# Patient Record
Sex: Female | Born: 2005
Health system: Southern US, Community
[De-identification: ages and names within clinical notes are randomized; demographics above are authoritative.]

## PROBLEM LIST (undated history)

## (undated) DIAGNOSIS — F909 Attention-deficit hyperactivity disorder, unspecified type: Secondary | ICD-10-CM

## (undated) DIAGNOSIS — K589 Irritable bowel syndrome without diarrhea: Secondary | ICD-10-CM

## (undated) DIAGNOSIS — F419 Anxiety disorder, unspecified: Secondary | ICD-10-CM

## (undated) DIAGNOSIS — F32A Depression, unspecified: Secondary | ICD-10-CM

## (undated) HISTORY — PX: ESOPHAGOGASTRODUODENOSCOPY: SHX1529

## (undated) HISTORY — PX: NO PAST SURGERIES: SHX2092

## (undated) HISTORY — DX: Irritable bowel syndrome, unspecified: K58.9

---
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
## 2023-01-20 ENCOUNTER — Other Ambulatory Visit (HOSPITAL_COMMUNITY): Payer: Self-pay

## 2023-01-20 DIAGNOSIS — F411 Generalized anxiety disorder: Secondary | ICD-10-CM | POA: Diagnosis not present

## 2023-01-20 MED ORDER — SERTRALINE HCL 50 MG PO TABS
50.0000 mg | ORAL_TABLET | Freq: Every morning | ORAL | 0 refills | Status: AC
  Filled 2023-01-20: qty 90, 90d supply, fill #0

## 2023-01-21 ENCOUNTER — Other Ambulatory Visit: Payer: Self-pay

## 2023-01-24 ENCOUNTER — Other Ambulatory Visit (HOSPITAL_COMMUNITY): Payer: Self-pay

## 2023-01-26 ENCOUNTER — Other Ambulatory Visit (HOSPITAL_COMMUNITY): Payer: Self-pay

## 2023-01-27 ENCOUNTER — Other Ambulatory Visit: Payer: Self-pay

## 2023-01-27 ENCOUNTER — Other Ambulatory Visit (HOSPITAL_COMMUNITY): Payer: Self-pay

## 2023-01-27 DIAGNOSIS — J029 Acute pharyngitis, unspecified: Secondary | ICD-10-CM | POA: Diagnosis not present

## 2023-01-27 MED ORDER — PREDNISONE 10 MG PO TABS
40.0000 mg | ORAL_TABLET | Freq: Every day | ORAL | 0 refills | Status: AC
  Filled 2023-01-27: qty 20, 5d supply, fill #0

## 2023-01-27 MED ORDER — NORELGESTROMIN-ETH ESTRADIOL 150-35 MCG/24HR TD PTWK
MEDICATED_PATCH | TRANSDERMAL | 0 refills | Status: DC
  Filled 2023-01-27: qty 9, 84d supply, fill #0

## 2023-01-27 MED ORDER — FLUTICASONE PROPIONATE 50 MCG/ACT NA SUSP
1.0000 | Freq: Every day | NASAL | 0 refills | Status: AC
  Filled 2023-01-27: qty 16, 30d supply, fill #0

## 2023-02-09 ENCOUNTER — Ambulatory Visit
Admission: EM | Admit: 2023-02-09 | Discharge: 2023-02-09 | Disposition: A | Discharge status: Home / Self Care | Payer: 59 | Attending: Family Medicine | Admitting: Family Medicine

## 2023-02-09 ENCOUNTER — Other Ambulatory Visit: Payer: Self-pay

## 2023-02-09 ENCOUNTER — Ambulatory Visit: Payer: 59

## 2023-02-09 ENCOUNTER — Encounter: Payer: Self-pay | Admitting: Emergency Medicine

## 2023-02-09 DIAGNOSIS — S61213A Laceration without foreign body of left middle finger without damage to nail, initial encounter: Secondary | ICD-10-CM

## 2023-02-09 DIAGNOSIS — S67193A Crushing injury of left middle finger, initial encounter: Secondary | ICD-10-CM

## 2023-02-09 DIAGNOSIS — S6992XA Unspecified injury of left wrist, hand and finger(s), initial encounter: Secondary | ICD-10-CM

## 2023-02-09 MED ORDER — CEPHALEXIN 500 MG PO CAPS: 500.0000 mg | ORAL_CAPSULE | Freq: Two times a day (BID) | ORAL | 0 refills | Status: AC

## 2023-02-09 NOTE — Discharge Instructions (Addendum)
Patient advised of left middle finger x-ray results advised patient to keep wound area dry and clean for the next 36 hours.  Advised patient to allow Steri-Strips to come off on its own.  Advised may follow-up in 48 hours here for wound check.  Advised if symptoms worsen and/or unresolved please follow-up with PCP or here for further evaluation.

## 2023-02-09 NOTE — ED Notes (Signed)
Provided ice pack

## 2023-02-09 NOTE — ED Notes (Signed)
Patient took 3 advil at home prior to arrival in [facility]

## 2023-02-09 NOTE — ED Provider Notes (Signed)
[facility]    CSN: [id number] Arrival date & time: 02/09/23  1726      History   Chief Complaint Chief Complaint  Patient presents with   Finger Injury    HPI Joanna Thornton is a 17 y.o. female.   HPI 17 year old female presents with left hand third finger injury that occurred recently she accidentally shot her left third finger in car door.  PMH significant for ADHD, depression, and IBS.  Past Medical History:  Diagnosis Date   ADHD (attention deficit hyperactivity disorder)    Anxiety    Depression    IBS (irritable bowel syndrome)     Patient Active Problem List   Diagnosis Date Noted   Precocious puberty 12/06/2012   Short stature for age 42/23/2014   Adopted 12/06/2012    Past Surgical History:  Procedure Laterality Date   ESOPHAGOGASTRODUODENOSCOPY     NO PAST SURGERIES      OB History   No obstetric history on file.      Home Medications    Prior to Admission medications   Medication Sig Start Date End Date Taking? Authorizing Provider  cephALEXin (KEFLEX) 500 MG capsule Take 1 capsule (500 mg total) by mouth 2 (two) times daily for 5 days. 02/09/23 02/14/23 Yes Trevor Iha, FNP  clobetasol ointment (TEMOVATE) 0.05 % Apply 1 thin Application topically 2 (two) times daily to affected area for 10 days. 05/03/22     clonazePAM (KLONOPIN) 0.5 MG tablet Take 1/2 - 1 tablet by mouth daily as needed for severe anxiety/panic. 10/14/22     EPINEPHrine 0.3 mg/0.3 mL IJ SOAJ injection Inject 0.3 mg into the muscle as needed for anaphylaxis. 02/15/22   Ferol Luz, MD  fluticasone (FLONASE) 50 MCG/ACT nasal spray Place 1 spray into both nostrils daily. 01/27/23     norelgestromin-ethinyl estradiol [employer]) 150-35 MCG/24HR transdermal patch Apply a patch to the upper outer arm, abdomen, buttock or back weekly for 3 weeks.  Week 4 is patch-free. Apply patch on same day of the week. Wear 1 patch at a time. 12/11/21     norelgestromin-ethinyl estradiol  (ZAFEMY) 150-35 MCG/24HR transdermal patch Apply 1 patch as directed for 3 weeks. Remove for 1 week. Apply new patch on the same day of the week. 09/29/21     norelgestromin-ethinyl estradiol (ZAFEMY) 150-35 MCG/24HR transdermal patch Apply new patch to the upper outer arm, abdomen, buttock or back each week for 3 weeks. Week 4 is patch-free. Apply new patch on the same day of the week. Wear only 1 patch at a time. 01/27/23     norethindrone (AYGESTIN) 5 MG tablet Take 5 mg by mouth daily. 11/13/21   [provider]  nortriptyline (PAMELOR) 10 MG capsule Take 1 capsule (10 mg total) by mouth at bedtime. Patient not taking: Reported on 02/09/2023 11/16/21 12/16/21  Dozier-Lineberger, Mayah M, NP  ondansetron (ZOFRAN) 4 MG tablet Take 1 tablet (4 mg total) by mouth every 6 (six) hours as needed for nausea or vomiting. 12/22/20   Gustavus Bryant, FNP  pantoprazole (PROTONIX) 40 MG tablet Take 1 tablet (40 mg total) by mouth daily. Patient not taking: Reported on 02/09/2023 04/29/21     predniSONE (DELTASONE) 10 MG tablet Take 4 tablets (40 mg total) by mouth daily. Patient not taking: Reported on 02/09/2023 01/27/23     predniSONE (DELTASONE) 20 MG tablet Take 2 tablets daily with breakfast. Patient not taking: Reported on 02/09/2023 11/20/22   Wallis Bamberg, PA-C  sertraline (  ZOLOFT) 25 MG tablet Take 1 tablet (25 mg total) by mouth in the morning. 01/04/22     sertraline (ZOLOFT) 25 MG tablet Take 1 tablet (25 mg total) by mouth every morning. 02/23/22     sertraline (ZOLOFT) 50 MG tablet Take 50 mg by mouth daily. 08/07/21   [provider]  sertraline (ZOLOFT) 50 MG tablet Take 1 and 1/2 tablets by mouth daily. 09/18/21     sertraline (ZOLOFT) 50 MG tablet Take 1 tablet by mouth each morning. 12/04/21     sertraline (ZOLOFT) 50 MG tablet Take 1 tablet (50 mg total) by mouth every morning. 04/15/22     sertraline (ZOLOFT) 50 MG tablet Take 1 tablet (50 mg) by mouth in the morning. 10/14/22     sertraline  (ZOLOFT) 50 MG tablet Take 1 tablet (50 mg total) by mouth in the morning. 01/20/23       Family History Family History  Adopted: Yes    Social History Social History   Tobacco Use   Smoking status: Never    Passive exposure: Past   Smokeless tobacco: Never  Vaping Use   Vaping status: Never Used  Substance Use Topics   Alcohol use: Never   Drug use: Never     Allergies   Amoxicillin-pot clavulanate and Escitalopram   Review of Systems Review of Systems   Physical Exam Triage Vital Signs ED Triage Vitals  Encounter Vitals Group     BP      Systolic BP Percentile      Diastolic BP Percentile      Pulse      Resp      Temp      Temp src      SpO2      Weight      Height      Head Circumference      Peak Flow      Pain Score      Pain Loc      Pain Education      Exclude from Growth Chart    No data found.  Updated Vital Signs BP 138/84 (BP Location: Right Arm)   Pulse 77   Temp 98.4 F (36.9 C) (Oral)   Resp 20   LMP 01/26/2023   SpO2 100%    Physical Exam Vitals and nursing note reviewed.  Constitutional:      Appearance: Normal appearance. She is normal weight.  HENT:     Head: Normocephalic and atraumatic.     Mouth/Throat:     Mouth: Mucous membranes are moist.     Pharynx: Oropharynx is clear.  Eyes:     Extraocular Movements: Extraocular movements intact.     Conjunctiva/sclera: Conjunctivae normal.     Pupils: Pupils are equal, round, and reactive to light.  Cardiovascular:     Rate and Rhythm: Normal rate and regular rhythm.     Pulses: Normal pulses.     Heart sounds: Normal heart sounds.  Pulmonary:     Effort: Pulmonary effort is normal.     Breath sounds: Normal breath sounds. No wheezing, rhonchi or rales.  Musculoskeletal:        General: Normal range of motion.     Cervical back: Normal range of motion and neck supple.  Skin:    General: Skin is warm and dry.     Comments: Left middle finger (volar aspect of DIP):~1.0  cm x 1 mm laceration noted, homeostasis achieved by direct pressure  Neurological:     General: No focal deficit present.     Mental Status: She is alert and oriented to person, place, and time. Mental status is at baseline.  Psychiatric:        Mood and Affect: Mood normal.        Behavior: Behavior normal.        Thought Content: Thought content normal.      UC Treatments / Results  Labs (all labs ordered are listed, but only abnormal results are displayed) Labs Reviewed - No data to display  EKG   Radiology DG Hand Complete Left  Result Date: 02/09/2023 CLINICAL DATA:  Provided history: Left middle finger injury per patient just short finger in car door EXAM: LEFT HAND - COMPLETE 3+ VIEW COMPARISON:  None Available. FINDINGS: There is no evidence of fracture or dislocation. There is no evidence of arthropathy or other focal bone abnormality. Soft tissue edema about the distal third digit. No radiopaque foreign body or soft tissue gas. IMPRESSION: Soft tissue edema about the distal third digit. No fracture or dislocation. Electronically Signed   By: Narda Rutherford M.D.   On: 02/09/2023 18:04    Procedures Procedures (including critical care time)  Medications Ordered in UC Medications - No data to display  Initial Impression / Assessment and Plan / UC Course  I have reviewed the triage vital signs and the nursing notes.  Pertinent labs & imaging results that were available during my care of the patient were reviewed by me and considered in my medical decision making (see chart for details).     MDM: 1.  Injury of left middle finger, initial encounter patient advised of left middle finger x-ray results advised patient to keep wound area dry and clean for the next 36 hours.  Advised patient to allow Steri-Strips to come off on its own.  Advised may follow-up in 48 hours here for wound check.  2.  Laceration of left middle finger without foreign body without nail damage,  initial encounter.  Half-inch x 2 inch Steri-Strip placed over DIP of left middle finger without complication.  Advised if symptoms worsen and/or unresolved please follow-up with PCP or here for further evaluation. Final Clinical Impressions(s) / UC Diagnoses   Final diagnoses:  Injury of left middle finger, initial encounter  Laceration of left middle finger without foreign body without damage to nail, initial encounter     Discharge Instructions      Patient advised of left middle finger x-ray results advised patient to keep wound area dry and clean for the next 36 hours.  Advised patient to allow Steri-Strips to come off on its own.  Advised may follow-up in 48 hours here for wound check.  Advised if symptoms worsen and/or unresolved please follow-up with PCP or here for further evaluation.     ED Prescriptions     Medication Sig Dispense Auth. Provider   cephALEXin (KEFLEX) 500 MG capsule Take 1 capsule (500 mg total) by mouth 2 (two) times daily for 5 days. 10 capsule Trevor Iha, FNP      PDMP not reviewed this encounter.   Trevor Iha, FNP 02/09/23 1842

## 2023-02-09 NOTE — ED Triage Notes (Addendum)
Left middle finger was shut in a car door prior to arrival to [facility]  Laceration to middle finger tip.  Dried blood present.  Brisk cap refill.  Able to bend finger.  Removed 2 rings from this finger and given to patient

## 2023-02-10 ENCOUNTER — Telehealth: Payer: Self-pay | Admitting: Family Medicine

## 2023-02-10 ENCOUNTER — Telehealth: Payer: Self-pay | Admitting: Emergency Medicine

## 2023-02-10 ENCOUNTER — Other Ambulatory Visit (HOSPITAL_COMMUNITY): Payer: Self-pay

## 2023-02-10 MED ORDER — DOXYCYCLINE HYCLATE 100 MG PO CAPS: 100.0000 mg | ORAL_CAPSULE | Freq: Two times a day (BID) | ORAL | 0 refills | Status: AC

## 2023-02-10 NOTE — Telephone Encounter (Signed)
Patient has allergy to Keflex.  Doxycycline 100 mg capsule twice daily x 5 days has been sent to requested pharmacy.

## 2023-02-10 NOTE — Telephone Encounter (Signed)
Received call from patient's mother about her not being able to take the antibiotic we prescribed. Tried calling back, no answer. Tried calling patient's phone number listed, no answer. Left message on patient's cell to call back.

## 2023-02-10 NOTE — Telephone Encounter (Signed)
Mother called stating the provider prescribed the patient a medication she was allergic to. Spoke with her daughter, the patient, and received verbal permission to speak with mother. The mother states the pharmacy told them they would not be able to take Keflex if she was allergic to augmentin. Mother states she's had keflex before and it caused "violent vomiting." I added keflex to the patient's allergy list, and will notify provider to update new antibiotic prescription.

## 2023-02-22 DIAGNOSIS — Z118 Encounter for screening for other infectious and parasitic diseases: Secondary | ICD-10-CM | POA: Diagnosis not present

## 2023-02-22 DIAGNOSIS — Z1331 Encounter for screening for depression: Secondary | ICD-10-CM | POA: Diagnosis not present

## 2023-02-22 DIAGNOSIS — Z01419 Encounter for gynecological examination (general) (routine) without abnormal findings: Secondary | ICD-10-CM | POA: Diagnosis not present

## 2023-02-25 ENCOUNTER — Other Ambulatory Visit (HOSPITAL_COMMUNITY): Payer: Self-pay

## 2023-02-25 MED ORDER — TWIRLA 120-30 MCG/24HR TD PTWK
1.0000 | MEDICATED_PATCH | TRANSDERMAL | 3 refills | Status: DC
  Filled 2023-02-25: qty 12, 84d supply, fill #0

## 2023-02-26 ENCOUNTER — Other Ambulatory Visit (HOSPITAL_COMMUNITY): Payer: Self-pay

## 2023-02-28 ENCOUNTER — Other Ambulatory Visit (HOSPITAL_COMMUNITY): Payer: Self-pay

## 2023-03-01 ENCOUNTER — Other Ambulatory Visit (HOSPITAL_COMMUNITY): Payer: Self-pay

## 2023-03-02 ENCOUNTER — Other Ambulatory Visit (HOSPITAL_COMMUNITY): Payer: Self-pay

## 2023-03-03 ENCOUNTER — Other Ambulatory Visit (HOSPITAL_COMMUNITY): Payer: Self-pay

## 2023-03-03 MED ORDER — NORELGESTROMIN-ETH ESTRADIOL 150-35 MCG/24HR TD PTWK
MEDICATED_PATCH | TRANSDERMAL | 3 refills | Status: DC
  Filled 2023-03-03: qty 12, 84d supply, fill #0
  Filled 2023-04-18: qty 12, 63d supply, fill #0
  Filled 2023-07-27: qty 12, 63d supply, fill #1
  Filled 2023-11-17: qty 12, 63d supply, fill #2

## 2023-04-18 ENCOUNTER — Other Ambulatory Visit (HOSPITAL_COMMUNITY): Payer: Self-pay

## 2023-04-25 ENCOUNTER — Other Ambulatory Visit (HOSPITAL_COMMUNITY): Payer: Self-pay

## 2023-04-25 DIAGNOSIS — F411 Generalized anxiety disorder: Secondary | ICD-10-CM | POA: Diagnosis not present

## 2023-04-25 MED ORDER — SERTRALINE HCL 50 MG PO TABS
50.0000 mg | ORAL_TABLET | Freq: Every morning | ORAL | 1 refills | Status: DC
  Filled 2023-04-25: qty 90, 90d supply, fill #0
  Filled 2023-07-04: qty 90, 90d supply, fill #1

## 2023-07-04 ENCOUNTER — Other Ambulatory Visit (HOSPITAL_COMMUNITY): Payer: Self-pay

## 2023-07-27 ENCOUNTER — Other Ambulatory Visit (HOSPITAL_COMMUNITY): Payer: Self-pay

## 2023-07-28 ENCOUNTER — Other Ambulatory Visit (HOSPITAL_COMMUNITY): Payer: Self-pay

## 2023-08-03 ENCOUNTER — Other Ambulatory Visit (HOSPITAL_COMMUNITY): Payer: Self-pay

## 2023-08-03 DIAGNOSIS — F411 Generalized anxiety disorder: Secondary | ICD-10-CM | POA: Diagnosis not present

## 2023-08-03 MED ORDER — SERTRALINE HCL 50 MG PO TABS
75.0000 mg | ORAL_TABLET | Freq: Every morning | ORAL | 1 refills | Status: AC
Start: 2023-08-03 — End: ?

## 2023-10-06 DIAGNOSIS — H52223 Regular astigmatism, bilateral: Secondary | ICD-10-CM | POA: Diagnosis not present

## 2023-10-06 DIAGNOSIS — H40053 Ocular hypertension, bilateral: Secondary | ICD-10-CM | POA: Diagnosis not present

## 2023-10-06 DIAGNOSIS — H5213 Myopia, bilateral: Secondary | ICD-10-CM | POA: Diagnosis not present

## 2023-10-14 ENCOUNTER — Ambulatory Visit (INDEPENDENT_AMBULATORY_CARE_PROVIDER_SITE_OTHER): Admitting: Family

## 2023-10-14 ENCOUNTER — Encounter: Payer: Self-pay | Admitting: Family

## 2023-10-14 ENCOUNTER — Other Ambulatory Visit: Payer: Self-pay

## 2023-10-14 VITALS — BP 110/72 | HR 80 | Temp 99.1°F | Resp 20 | Wt 92.1 lb

## 2023-10-14 DIAGNOSIS — T781XXD Other adverse food reactions, not elsewhere classified, subsequent encounter: Secondary | ICD-10-CM | POA: Diagnosis not present

## 2023-10-14 DIAGNOSIS — K588 Other irritable bowel syndrome: Secondary | ICD-10-CM | POA: Diagnosis not present

## 2023-10-14 DIAGNOSIS — J3089 Other allergic rhinitis: Secondary | ICD-10-CM

## 2023-10-14 DIAGNOSIS — J302 Other seasonal allergic rhinitis: Secondary | ICD-10-CM

## 2023-10-14 DIAGNOSIS — T7819XD Other adverse food reactions, not elsewhere classified, subsequent encounter: Secondary | ICD-10-CM

## 2023-10-14 MED ORDER — AZELASTINE HCL 0.1 % NA SOLN
NASAL | 5 refills | Status: AC
  Filled 2023-10-14: qty 30, 30d supply, fill #0

## 2023-10-14 MED ORDER — LEVOCETIRIZINE DIHYDROCHLORIDE 5 MG PO TABS
5.0000 mg | ORAL_TABLET | Freq: Every evening | ORAL | 5 refills | Status: DC
  Filled 2023-10-14: qty 30, 30d supply, fill #0
  Filled 2023-12-22: qty 30, 30d supply, fill #1
  Filled 2024-02-09: qty 30, 30d supply, fill #2
  Filled 2024-03-09 (×2): qty 30, 30d supply, fill #0
  Filled 2024-04-16: qty 30, 30d supply, fill #1
  Filled 2024-05-15: qty 30, 30d supply, fill #2

## 2023-10-14 MED ORDER — EPINEPHRINE 0.3 MG/0.3ML IJ SOAJ
0.3000 mg | INTRAMUSCULAR | 1 refills | Status: AC | PRN
  Filled 2023-10-14: qty 2, 2d supply, fill #0

## 2023-10-14 NOTE — Progress Notes (Signed)
 [address] [place] [address] Dept: [phone]  FOLLOW UP NOTE  Patient ID: Joanna Thornton, female    DOB: 04-04-2006  Age: 18 y.o. MRN: [id number] Date of Office Visit: 10/14/2023  Assessment  Chief Complaint: Follow-up (Refill epi. Miserable 3/ 4 seasons zyrtec not working. Stopped using nasal saline) and Medication Refill  HPI Joanna Thornton is a 18 year old female who who presents today for follow-up of oral allergy  syndrome: Cherries and kiwi's, seasonal and perennial allergic rhinitis, and IBS/chronic abdominal pain.  She was last seen on February 15, 2022 by Dr. Jolayne Natter .Her mom is here with her today and helps provide history.  She denies any new diagnosis or surgery since her last office visit.  Oral allergy  syndrome: She reports that she continues to avoid cherries and kiwi's, but mentions there are other fruits that she now cannot tolerate such as apples, oranges, mangoes, peaches, pears, and pineapples.  They all cause itchy throat, itchy ears, itchy tongue, itchy lips and  itchy mouth.  She does not break out.  She can eat cooked apples without any problems.  She also mentions that she had a little reaction to strawberries 1 time where it caused itching.  Then another time she rinsed them in hot water and did not have any reaction.  When asked if she has any concomitant cardiorespiratory or gastrointestinal symptoms.  She reports that she does have anxiety and sometimes her anxiety will bother her.  She is not able to eat lavender because it causes her to throw up.  She reports that her epinephrine  autoinjector device has expired.  She is not interested in getting lab work to see if these are true allergies versus oral allergies.  She mentions that she will just continue to avoid.  Her mom mentions that she is able to eat blueberries and bananas without any problems..   Seasonal and perennial allergic rhinitis: She reports clear rhinorrhea, nasal congestion, and postnasal  drip.  At times she will occasionally have sinus pressure if she is outside for a long period of time.  She has not been treated for any sinus infections since we last saw her.  She is currently taking Zyrtec, but does not feel like it is effective.  She has not tried any other antihistamines.  She is not able to use the nose sprays due to them causing nosebleeds.  She is willing to try an antihistamine nasal spray to help with runny nose/drainage down the throat.  IBS/abdominal pain: She reports that she continues to follow-up with GI.  Her mom mentions that she has gained weight, but then this last month she had food poisoning and had COVID-19.  She is in the process of gaining weight back.   Drug Allergies:  Allergies  Allergen Reactions   Amoxicillin-Pot Clavulanate Other (See Comments)    Vomiting    Keflex  [Cephalexin ] Nausea And Vomiting    "Violently vomiting"   Escitalopram  Anxiety    Review of Systems: Negative except as per HPI   Physical Exam: BP 110/72   Pulse 80   Temp 99.1 F (37.3 C) (Oral)   Resp 20   Wt 92 lb 1.6 oz (41.8 kg)   SpO2 98%    Physical Exam Exam conducted with a chaperone present (mom present).  Constitutional:      Appearance: Normal appearance.  HENT:     Head: Normocephalic and atraumatic.     Comments: Pharynx normal, eyes normal, ears normal, nose:  Bilateral lower turbinates mildly edematous with no drainage noted    Right Ear: Tympanic membrane, ear canal and external ear normal.     Left Ear: Tympanic membrane, ear canal and external ear normal.     Mouth/Throat:     Mouth: Mucous membranes are moist.     Pharynx: Oropharynx is clear.  Eyes:     Conjunctiva/sclera: Conjunctivae normal.  Cardiovascular:     Rate and Rhythm: Regular rhythm.     Heart sounds: Normal heart sounds.  Pulmonary:     Effort: Pulmonary effort is normal.     Breath sounds: Normal breath sounds.     Comments: Lungs clear to auscultation Musculoskeletal:      Cervical back: Neck supple.  Skin:    General: Skin is warm.  Neurological:     Mental Status: She is alert and oriented to person, place, and time.  Psychiatric:        Mood and Affect: Mood normal.        Behavior: Behavior normal.        Thought Content: Thought content normal.        Judgment: Judgment normal.     Diagnostics: None  Assessment and Plan: 1. Pollen-food allergy , subsequent encounter   2. Adverse food reaction, subsequent encounter   3. Seasonal and perennial allergic rhinitis   4. Other irritable bowel syndrome     Meds ordered this encounter  Medications   EPINEPHrine  0.3 mg/0.3 mL IJ SOAJ injection    Sig: Inject 0.3 mg into the muscle as needed for anaphylaxis.    Dispense:  2 each    Refill:  1   levocetirizine (XYZAL  ALLERGY  24HR) 5 MG tablet    Sig: Take 1 tablet (5 mg total) by mouth every evening.    Dispense:  30 tablet    Refill:  5   azelastine  (ASTELIN ) 0.1 % nasal spray    Sig: Use 1-2 sprays in each nostril once to twice a day as needed for runny nose/drainage down throat    Dispense:  30 mL    Refill:  5    Patient Instructions  Oral Allergy  Syndrome:  - Given your symptoms with cherries , Kiwi, apples, oranges, mangoes, peaches, and pears recommend strict avoidance of these foods and other fruits  that bother you. An epipen  has been prescribed; demonstration given on how to use. - Also avoid lavender  -She does not wish to get lab work to check and see if this is true allergy  versus oral allergy  syndrome. - for SKIN only reaction, okay to take Benadryl  1 capsules every 6 hours - for SKIN + ANY additional symptoms, OR IF concern for LIFE THREATENING reaction = Epipen  Autoinjector EpiPen  0.3 mg. - If using Epinephrine  autoinjector, call 911 - A food allergy  action plan has been provided and discussed. - Medic Alert identification is recommended.   Otherwise: - Your symptoms are not consistent with true food allergies, and are more  likely to be due to oral allergy  syndrome. - These symptoms are not life-threatening and are because of a cross reaction between a pollen you are allergic to, and to a protein in specific foods (such as fresh fruits, vegetables, and nuts). - If you can eat these things it is fine to continue to do so.  If not, you may avoid these fresh fruits.  Heating these foods should allow them to be consumed without symptoms. - You may notice increase in symptoms during allergy  season, this  is to be expected. - Allergy   Immunotherapy can help lessen and some cases cure these symptoms and should be considered if they worsen.    Seasonal and Perennial  Rhinitis: Unable to use steroid nasal sprays due to nosebleeds - allergy  testing on Oct 2023 was positive to grass pollen, weed pollen, tree pollen, mold, cat, mixed feathers -  Continue allergen avoidance measures - consider allergy  shots as long term control of your symptoms by teaching your immune system to be more tolerant of your allergy  triggers - Stop Zyrtec (cetirizine) 10 mg - Start azelastine nasal spray using 1 to 2 sprays in each nostril once or twice a day as needed for runny nose/drainage down throat -Start Xyzal (Levocetirinze) 5mg  daily as needed for runny nose/itching  IBS/Chronic abdominal pain  -Continue to avoid trigger foods -Continue to follow with pediatric GI at [facility]   Follow up: 2  months or as needed     Return in about 2 months (around 12/14/2023).    Thank you for the opportunity to care for this patient.  Please do not hesitate to contact me with questions.  Tinnie Forehand, FNP [facility] 

## 2023-10-14 NOTE — Patient Instructions (Addendum)
 Oral Allergy  Syndrome:  - Given your symptoms with cherries , Kiwi, apples, oranges, mangoes, peaches, and pears recommend strict avoidance of these foods and other fruits  that bother you. An epipen  has been prescribed; demonstration given on how to use. - Also avoid lavender  -She does not wish to get lab work to check and see if this is true allergy  versus oral allergy  syndrome. - for SKIN only reaction, okay to take Benadryl  1 capsules every 6 hours - for SKIN + ANY additional symptoms, OR IF concern for LIFE THREATENING reaction = Epipen  Autoinjector EpiPen  0.3 mg. - If using Epinephrine  autoinjector, call 911 - A food allergy  action plan has been provided and discussed. - Medic Alert identification is recommended.   Otherwise: - Your symptoms are not consistent with true food allergies, and are more likely to be due to oral allergy  syndrome. - These symptoms are not life-threatening and are because of a cross reaction between a pollen you are allergic to, and to a protein in specific foods (such as fresh fruits, vegetables, and nuts). - If you can eat these things it is fine to continue to do so.  If not, you may avoid these fresh fruits.  Heating these foods should allow them to be consumed without symptoms. - You may notice increase in symptoms during allergy  season, this is to be expected. - Allergy   Immunotherapy can help lessen and some cases cure these symptoms and should be considered if they worsen.    Seasonal and Perennial  Rhinitis: Unable to use steroid nasal sprays due to nosebleeds - allergy  testing on Oct 2023 was positive to grass pollen, weed pollen, tree pollen, mold, cat, mixed feathers -  Continue allergen avoidance measures - consider allergy  shots as long term control of your symptoms by teaching your immune system to be more tolerant of your allergy  triggers - Stop Zyrtec (cetirizine) 10 mg - Start azelastine nasal spray using 1 to 2 sprays in each nostril once or  twice a day as needed for runny nose/drainage down throat -Start Xyzal (Levocetirinze) 5mg  daily as needed for runny nose/itching  IBS/Chronic abdominal pain  -Continue to avoid trigger foods -Continue to follow with pediatric GI at [facility]   Follow up: 2  months or as needed

## 2023-10-15 ENCOUNTER — Other Ambulatory Visit (HOSPITAL_BASED_OUTPATIENT_CLINIC_OR_DEPARTMENT_OTHER): Payer: Self-pay

## 2023-10-15 ENCOUNTER — Other Ambulatory Visit (HOSPITAL_COMMUNITY): Payer: Self-pay

## 2023-10-17 ENCOUNTER — Other Ambulatory Visit (HOSPITAL_COMMUNITY): Payer: Self-pay

## 2023-11-03 DIAGNOSIS — F411 Generalized anxiety disorder: Secondary | ICD-10-CM | POA: Diagnosis not present

## 2023-11-17 ENCOUNTER — Other Ambulatory Visit (HOSPITAL_COMMUNITY): Payer: Self-pay

## 2023-11-17 DIAGNOSIS — F411 Generalized anxiety disorder: Secondary | ICD-10-CM | POA: Diagnosis not present

## 2023-12-01 DIAGNOSIS — F411 Generalized anxiety disorder: Secondary | ICD-10-CM | POA: Diagnosis not present

## 2023-12-08 DIAGNOSIS — R41840 Attention and concentration deficit: Secondary | ICD-10-CM | POA: Diagnosis not present

## 2023-12-15 DIAGNOSIS — R41840 Attention and concentration deficit: Secondary | ICD-10-CM | POA: Diagnosis not present

## 2023-12-16 ENCOUNTER — Other Ambulatory Visit (HOSPITAL_BASED_OUTPATIENT_CLINIC_OR_DEPARTMENT_OTHER): Payer: Self-pay

## 2023-12-16 DIAGNOSIS — F411 Generalized anxiety disorder: Secondary | ICD-10-CM | POA: Diagnosis not present

## 2023-12-16 DIAGNOSIS — F428 Other obsessive-compulsive disorder: Secondary | ICD-10-CM | POA: Diagnosis not present

## 2023-12-16 MED ORDER — FLUVOXAMINE MALEATE 25 MG PO TABS
25.0000 mg | ORAL_TABLET | Freq: Every day | ORAL | 1 refills | Status: AC
  Filled 2023-12-16: qty 30, 30d supply, fill #0

## 2023-12-19 ENCOUNTER — Other Ambulatory Visit (HOSPITAL_COMMUNITY): Payer: Self-pay

## 2023-12-22 ENCOUNTER — Other Ambulatory Visit (HOSPITAL_COMMUNITY): Payer: Self-pay

## 2023-12-29 DIAGNOSIS — F411 Generalized anxiety disorder: Secondary | ICD-10-CM | POA: Diagnosis not present

## 2024-01-10 ENCOUNTER — Other Ambulatory Visit: Payer: Self-pay

## 2024-01-10 ENCOUNTER — Other Ambulatory Visit (HOSPITAL_COMMUNITY): Payer: Self-pay

## 2024-01-10 ENCOUNTER — Other Ambulatory Visit: Payer: Self-pay | Admitting: Pediatric Endocrinology

## 2024-01-10 DIAGNOSIS — E301 Precocious puberty: Secondary | ICD-10-CM

## 2024-01-10 DIAGNOSIS — F411 Generalized anxiety disorder: Secondary | ICD-10-CM | POA: Diagnosis not present

## 2024-01-10 DIAGNOSIS — F428 Other obsessive-compulsive disorder: Secondary | ICD-10-CM | POA: Diagnosis not present

## 2024-01-10 MED ORDER — DESVENLAFAXINE SUCCINATE ER 25 MG PO TB24
25.0000 mg | ORAL_TABLET | Freq: Every morning | ORAL | 1 refills | Status: DC
  Filled 2024-01-10 – 2024-05-18 (×3): qty 30, 30d supply, fill #0

## 2024-01-12 DIAGNOSIS — F411 Generalized anxiety disorder: Secondary | ICD-10-CM | POA: Diagnosis not present

## 2024-01-26 DIAGNOSIS — F411 Generalized anxiety disorder: Secondary | ICD-10-CM | POA: Diagnosis not present

## 2024-02-02 ENCOUNTER — Other Ambulatory Visit (HOSPITAL_COMMUNITY): Payer: Self-pay

## 2024-02-02 ENCOUNTER — Other Ambulatory Visit: Payer: Self-pay

## 2024-02-02 DIAGNOSIS — F411 Generalized anxiety disorder: Secondary | ICD-10-CM | POA: Diagnosis not present

## 2024-02-02 DIAGNOSIS — F428 Other obsessive-compulsive disorder: Secondary | ICD-10-CM | POA: Diagnosis not present

## 2024-02-02 MED ORDER — DESVENLAFAXINE SUCCINATE ER 50 MG PO TB24
50.0000 mg | ORAL_TABLET | Freq: Every morning | ORAL | 1 refills | Status: DC
  Filled 2024-02-02 – 2024-03-09 (×2): qty 30, 30d supply, fill #0
  Filled 2024-03-09: qty 30, 30d supply, fill #1

## 2024-02-03 ENCOUNTER — Other Ambulatory Visit (HOSPITAL_COMMUNITY): Payer: Self-pay

## 2024-02-06 ENCOUNTER — Other Ambulatory Visit (HOSPITAL_COMMUNITY): Payer: Self-pay

## 2024-02-09 ENCOUNTER — Other Ambulatory Visit (HOSPITAL_COMMUNITY): Payer: Self-pay

## 2024-02-23 DIAGNOSIS — F411 Generalized anxiety disorder: Secondary | ICD-10-CM | POA: Diagnosis not present

## 2024-03-08 ENCOUNTER — Other Ambulatory Visit: Payer: Self-pay

## 2024-03-08 ENCOUNTER — Other Ambulatory Visit (HOSPITAL_COMMUNITY): Payer: Self-pay

## 2024-03-08 MED ORDER — NORELGESTROMIN-ETH ESTRADIOL 150-35 MCG/24HR TD PTWK
MEDICATED_PATCH | TRANSDERMAL | 0 refills | Status: AC
  Filled 2024-03-08: qty 12, 63d supply, fill #0

## 2024-03-09 ENCOUNTER — Other Ambulatory Visit (HOSPITAL_BASED_OUTPATIENT_CLINIC_OR_DEPARTMENT_OTHER): Payer: Self-pay

## 2024-03-12 ENCOUNTER — Other Ambulatory Visit: Payer: Self-pay

## 2024-03-22 ENCOUNTER — Other Ambulatory Visit: Payer: Self-pay

## 2024-03-22 ENCOUNTER — Other Ambulatory Visit (HOSPITAL_COMMUNITY): Payer: Self-pay

## 2024-03-22 DIAGNOSIS — F428 Other obsessive-compulsive disorder: Secondary | ICD-10-CM | POA: Diagnosis not present

## 2024-03-22 DIAGNOSIS — F411 Generalized anxiety disorder: Secondary | ICD-10-CM | POA: Diagnosis not present

## 2024-03-22 MED ORDER — DESVENLAFAXINE SUCCINATE ER 50 MG PO TB24
50.0000 mg | ORAL_TABLET | Freq: Every morning | ORAL | 0 refills | Status: AC
  Filled 2024-03-22: qty 90, 90d supply, fill #0
  Filled 2024-04-15: qty 30, 30d supply, fill #0
  Filled 2024-05-15: qty 30, 30d supply, fill #1
  Filled 2024-05-21: qty 60, 60d supply, fill #1

## 2024-04-15 ENCOUNTER — Other Ambulatory Visit (HOSPITAL_COMMUNITY): Payer: Self-pay

## 2024-04-16 ENCOUNTER — Other Ambulatory Visit (HOSPITAL_BASED_OUTPATIENT_CLINIC_OR_DEPARTMENT_OTHER): Payer: Self-pay

## 2024-05-15 ENCOUNTER — Other Ambulatory Visit (HOSPITAL_COMMUNITY): Payer: Self-pay

## 2024-05-15 ENCOUNTER — Other Ambulatory Visit (HOSPITAL_BASED_OUTPATIENT_CLINIC_OR_DEPARTMENT_OTHER): Payer: Self-pay

## 2024-05-16 ENCOUNTER — Other Ambulatory Visit (HOSPITAL_BASED_OUTPATIENT_CLINIC_OR_DEPARTMENT_OTHER): Payer: Self-pay

## 2024-05-16 ENCOUNTER — Other Ambulatory Visit (HOSPITAL_COMMUNITY): Payer: Self-pay

## 2024-05-16 MED ORDER — BENZONATATE 200 MG PO CAPS
200.0000 mg | ORAL_CAPSULE | Freq: Three times a day (TID) | ORAL | 0 refills | Status: AC | PRN
  Filled 2024-05-16: qty 30, 10d supply, fill #0

## 2024-05-18 ENCOUNTER — Other Ambulatory Visit (HOSPITAL_BASED_OUTPATIENT_CLINIC_OR_DEPARTMENT_OTHER): Payer: Self-pay

## 2024-05-18 ENCOUNTER — Other Ambulatory Visit: Payer: Self-pay

## 2024-05-19 ENCOUNTER — Other Ambulatory Visit (HOSPITAL_COMMUNITY): Payer: Self-pay

## 2024-05-21 ENCOUNTER — Other Ambulatory Visit (HOSPITAL_COMMUNITY): Payer: Self-pay

## 2024-05-21 ENCOUNTER — Other Ambulatory Visit (HOSPITAL_BASED_OUTPATIENT_CLINIC_OR_DEPARTMENT_OTHER): Payer: Self-pay

## 2024-05-22 ENCOUNTER — Other Ambulatory Visit (HOSPITAL_BASED_OUTPATIENT_CLINIC_OR_DEPARTMENT_OTHER): Payer: Self-pay

## 2024-06-19 ENCOUNTER — Other Ambulatory Visit: Payer: Self-pay | Admitting: Family

## 2024-06-19 ENCOUNTER — Other Ambulatory Visit (HOSPITAL_BASED_OUTPATIENT_CLINIC_OR_DEPARTMENT_OTHER): Payer: Self-pay

## 2024-06-19 MED ORDER — LEVOCETIRIZINE DIHYDROCHLORIDE 5 MG PO TABS
5.0000 mg | ORAL_TABLET | Freq: Every evening | ORAL | 5 refills | Status: AC
  Filled 2024-06-19: qty 30, 30d supply, fill #0
